# Patient Record
Sex: Male | Born: 1985 | Race: White | Hispanic: No | Marital: Single | Smoking: Never smoker
Health system: Southern US, Community
[De-identification: ages and names within clinical notes are randomized; demographics above are authoritative.]

## PROBLEM LIST (undated history)

## (undated) DIAGNOSIS — N200 Calculus of kidney: Secondary | ICD-10-CM

---

## 2016-06-04 ENCOUNTER — Emergency Department (HOSPITAL_COMMUNITY): Payer: Self-pay

## 2016-06-04 ENCOUNTER — Emergency Department (HOSPITAL_COMMUNITY)
Admission: EM | Admit: 2016-06-04 | Discharge: 2016-06-04 | Disposition: A | Discharge status: Home / Self Care | Payer: Self-pay | Attending: Emergency Medicine | Admitting: Emergency Medicine

## 2016-06-04 ENCOUNTER — Encounter (HOSPITAL_COMMUNITY): Payer: Self-pay

## 2016-06-04 DIAGNOSIS — J189 Pneumonia, unspecified organism: Secondary | ICD-10-CM | POA: Insufficient documentation

## 2016-06-04 DIAGNOSIS — Z79899 Other long term (current) drug therapy: Secondary | ICD-10-CM | POA: Insufficient documentation

## 2016-06-04 DIAGNOSIS — R319 Hematuria, unspecified: Secondary | ICD-10-CM | POA: Insufficient documentation

## 2016-06-04 HISTORY — DX: Calculus of kidney: N20.0

## 2016-06-04 LAB — URINALYSIS, ROUTINE W REFLEX MICROSCOPIC
Bilirubin Urine: NEGATIVE
Glucose, UA: NEGATIVE mg/dL
KETONES UR: NEGATIVE mg/dL
Leukocytes, UA: NEGATIVE
Nitrite: NEGATIVE
PROTEIN: NEGATIVE mg/dL
Specific Gravity, Urine: 1.028 (ref 1.005–1.030)
pH: 6.5 (ref 5.0–8.0)

## 2016-06-04 LAB — URINE MICROSCOPIC-ADD ON: BACTERIA UA: NONE SEEN

## 2016-06-04 LAB — I-STAT CREATININE, ED: Creatinine, Ser: 0.7 mg/dL (ref 0.61–1.24)

## 2016-06-04 MED ORDER — OXYCODONE-ACETAMINOPHEN 5-325 MG PO TABS
2.0000 | ORAL_TABLET | Freq: Once | ORAL | Status: AC
  Administered 2016-06-04: 2 via ORAL
  Filled 2016-06-04: qty 2

## 2016-06-04 MED ORDER — HYDROMORPHONE HCL 1 MG/ML IJ SOLN
1.0000 mg | Freq: Once | INTRAMUSCULAR | Status: AC
  Administered 2016-06-04: 1 mg via INTRAVENOUS
  Filled 2016-06-04: qty 1

## 2016-06-04 MED ORDER — AZITHROMYCIN 250 MG PO TABS: 250.0000 mg | ORAL_TABLET | Freq: Every day | ORAL | Status: AC

## 2016-06-04 MED ORDER — ONDANSETRON HCL 4 MG/2ML IJ SOLN
4.0000 mg | Freq: Once | INTRAMUSCULAR | Status: AC
  Administered 2016-06-04: 4 mg via INTRAVENOUS
  Filled 2016-06-04: qty 2

## 2016-06-04 MED ORDER — OXYCODONE-ACETAMINOPHEN 5-325 MG PO TABS: 1.0000 | ORAL_TABLET | ORAL | Status: AC | PRN

## 2016-06-04 NOTE — Discharge Instructions (Signed)
Community-Acquired Pneumonia, Adult °Pneumonia is an infection of the lungs. There are different types of pneumonia. One type can develop while a person is in a hospital. A different type, called community-acquired pneumonia, develops in people who are not, or have not recently been, in the hospital or other health care facility.  °CAUSES °Pneumonia may be caused by bacteria, viruses, or funguses. Community-acquired pneumonia is often caused by Streptococcus pneumonia bacteria. These bacteria are often passed from one person to another by breathing in droplets from the cough or sneeze of an infected person. °RISK FACTORS °The condition is more likely to develop in: °· People who have chronic diseases, such as chronic obstructive pulmonary disease (COPD), asthma, congestive heart failure, cystic fibrosis, diabetes, or kidney disease. °· People who have early-stage or late-stage HIV. °· People who have sickle cell disease. °· People who have had their spleen removed (splenectomy). °· People who have poor dental hygiene. °· People who have medical conditions that increase the risk of breathing in (aspirating) secretions their own mouth and nose.   °· People who have a weakened immune system (immunocompromised). °· People who smoke. °· People who travel to areas where pneumonia-causing germs commonly exist. °· People who are around animal habitats or animals that have pneumonia-causing germs, including birds, bats, rabbits, cats, and farm animals. °SYMPTOMS °Symptoms of this condition include: °· A dry cough. °· A wet (productive) cough. °· Fever. °· Sweating. °· Chest pain, especially when breathing deeply or coughing. °· Rapid breathing or difficulty breathing. °· Shortness of breath. °· Shaking chills. °· Fatigue. °· Muscle aches. °DIAGNOSIS °Your health care provider will take a medical history and perform a physical exam. You may also have other tests, including: °· Imaging studies of your chest, including  X-rays. °· Tests to check your blood oxygen level and other blood gases. °· Other tests on blood, mucus (sputum), fluid around your lungs (pleural fluid), and urine. °If your pneumonia is severe, other tests may be done to identify the specific cause of your illness. °TREATMENT °The type of treatment that you receive depends on many factors, such as the cause of your pneumonia, the medicines you take, and other medical conditions that you have. For most adults, treatment and recovery from pneumonia may occur at home. In some cases, treatment must happen in a hospital. Treatment may include: °· Antibiotic medicines, if the pneumonia was caused by bacteria. °· Antiviral medicines, if the pneumonia was caused by a virus. °· Medicines that are given by mouth or through an IV tube. °· Oxygen. °· Respiratory therapy. °Although rare, treating severe pneumonia may include: °· Mechanical ventilation. This is done if you are not breathing well on your own and you cannot maintain a safe blood oxygen level. °· Thoracentesis. This procedure removes fluid around one lung or both lungs to help you breathe better. °HOME CARE INSTRUCTIONS °· Take over-the-counter and prescription medicines only as told by your health care provider. °¨ Only take cough medicine if you are losing sleep. Understand that cough medicine can prevent your body's natural ability to remove mucus from your lungs. °¨ If you were prescribed an antibiotic medicine, take it as told by your health care provider. Do not stop taking the antibiotic even if you start to feel better. °· Sleep in a semi-upright position at night. Try sleeping in a reclining chair, or place a few pillows under your head. °· Do not use tobacco products, including cigarettes, chewing tobacco, and e-cigarettes. If you need help quitting, ask your health care provider. °· Drink enough water to keep your urine   clear or pale yellow. This will help to thin out mucus secretions in your  lungs. PREVENTION There are ways that you can decrease your risk of developing community-acquired pneumonia. Consider getting a pneumococcal vaccine if:  You are older than 31 years of age.  You are older than 31 years of age and are undergoing cancer treatment, have chronic lung disease, or have other medical conditions that affect your immune system. Ask your health care provider if this applies to you. There are different types and schedules of pneumococcal vaccines. Ask your health care provider which vaccination option is best for you. You may also prevent community-acquired pneumonia if you take these actions:  Get an influenza vaccine every year. Ask your health care provider which type of influenza vaccine is best for you.  Go to the dentist on a regular basis.  Wash your hands often. Use hand sanitizer if soap and water are not available. SEEK MEDICAL CARE IF:  You have a fever.  You are losing sleep because you cannot control your cough with cough medicine. SEEK IMMEDIATE MEDICAL CARE IF:  You have worsening shortness of breath.  You have increased chest pain.  Your sickness becomes worse, especially if you are an older adult or have a weakened immune system.  You cough up blood.   This information is not intended to replace advice given to you by your health care provider. Make sure you discuss any questions you have with your health care provider.   Document Released: 11/10/2005 Document Revised: 08/01/2015 Document Reviewed: 03/07/2015 Elsevier Interactive Patient Education 2016 [employer]   Hematuria, Adult Hematuria is blood in your urine. It can be caused by a bladder infection, kidney infection, prostate infection, kidney stone, or cancer of your urinary tract. Infections can usually be treated with medicine, and a kidney stone usually will pass through your urine. If neither of these is the cause of your hematuria, further workup to find out the reason may  be needed. It is very important that you tell your health care provider about any blood you see in your urine, even if the blood stops without treatment or happens without causing pain. Blood in your urine that happens and then stops and then happens again can be a symptom of a very serious condition. Also, pain is not a symptom in the initial stages of many urinary cancers. HOME CARE INSTRUCTIONS   Drink lots of fluid, 3-4 quarts a day. If you have been diagnosed with an infection, cranberry juice is especially recommended, in addition to large amounts of water.  Avoid caffeine, tea, and carbonated beverages because they tend to irritate the bladder.  Avoid alcohol because it may irritate the prostate.  Take all medicines as directed by your health care provider.  If you were prescribed an antibiotic medicine, finish it all even if you start to feel better.  If you have been diagnosed with a kidney stone, follow your health care provider's instructions regarding straining your urine to catch the stone.  Empty your bladder often. Avoid holding urine for long periods of time.  After a bowel movement, women should cleanse front to back. Use each tissue only once.  Empty your bladder before and after sexual intercourse if you are a male. SEEK MEDICAL CARE IF:  You develop back pain.  You have a fever.  You have a feeling of sickness in your stomach (nausea) or vomiting.  Your symptoms are not better in 3 days. Return sooner if you  are getting worse. SEEK IMMEDIATE MEDICAL CARE IF:   You develop severe vomiting and are unable to keep the medicine down.  You develop severe back or abdominal pain despite taking your medicines.  You begin passing a large amount of blood or clots in your urine.  You feel extremely weak or faint, or you pass out. MAKE SURE YOU:   Understand these instructions.  Will watch your condition.  Will get help right away if you are not doing well or get  worse.   This information is not intended to replace advice given to you by your health care provider. Make sure you discuss any questions you have with your health care provider.   Document Released: 11/10/2005 Document Revised: 12/01/2014 Document Reviewed: 07/11/2013 Elsevier Interactive Patient Education [employer]

## 2016-06-04 NOTE — ED Notes (Signed)
Nurse will attempt to draw labs on pt when they start the IV.

## 2016-06-04 NOTE — ED Provider Notes (Signed)
CSN: [id number]     Arrival date & time 06/04/16  0231 History   First MD Initiated Contact with Patient 06/04/16 0402     Chief Complaint  Patient presents with  . Flank Pain     (Consider location/radiation/quality/duration/timing/severity/associated sxs/prior Treatment) HPI Comments: 30 year old male with a history of kidney stones presents to the emergency department for left flank pain 1 day. He states that pain has been intermittent and waxing and waning in severity. He has noticed a gradual worsening of his pain since it began. He has taken Tylenol for symptoms without relief. He feels radiation of pain from his left flank around to his left lower quadrant. He states that his pain feels similar to prior kidney stones. She denies any gross hematuria, fever, vomiting, or syncope. No bowel changes. No history of abdominal surgeries.  Patient is a 30 y.o. male presenting with flank pain. The history is provided by the patient. No language interpreter was used.  Flank Pain This is a new problem. The current episode started yesterday. The problem occurs intermittently. The problem has been waxing and waning. Associated symptoms include abdominal pain and nausea. Pertinent negatives include no change in bowel habit, fever, numbness, rash, vomiting or weakness. Nothing aggravates the symptoms. He has tried acetaminophen for the symptoms. The treatment provided no relief.    Past Medical History  Diagnosis Date  . Kidney stones    History reviewed. No pertinent past surgical history. History reviewed. No pertinent family history. Social History  Substance Use Topics  . Smoking status: Never Smoker   . Smokeless tobacco: None  . Alcohol Use: No    Review of Systems  Constitutional: Negative for fever.  Gastrointestinal: Positive for nausea and abdominal pain. Negative for vomiting and change in bowel habit.  Genitourinary: Positive for flank pain.  Skin: Negative for rash.   Neurological: Negative for weakness and numbness.  All other systems reviewed and are negative.   Allergies  Review of patient's allergies indicates no known allergies.  Home Medications   Prior to Admission medications   Medication Sig Start Date End Date Taking? Authorizing Provider  acetaminophen (TYLENOL) 500 MG tablet Take 1,000 mg by mouth every 6 (six) hours as needed for mild pain, moderate pain or headache.   Yes Historical Provider, MD  azithromycin (ZITHROMAX) 250 MG tablet Take 1 tablet (250 mg total) by mouth daily. Take first 2 tablets together, then 1 every day until finished. 06/04/16   Antony MaduraKelly Oluwatimileyin Vivier, PA-C  oxyCODONE-acetaminophen (PERCOCET/ROXICET) 5-325 MG tablet Take 1 tablet by mouth every 4 (four) hours as needed for severe pain. 06/04/16   Antony MaduraKelly Onyx Edgley, PA-C   BP 120/76 mmHg  Pulse 88  Temp(Src) 98.3 F (36.8 C) (Oral)  Resp 18  Ht 5\' 11"  (1.803 m)  Wt 127.007 kg  BMI 39.07 kg/m2  SpO2 99%   Physical Exam  Constitutional: He is oriented to person, place, and time. He appears well-developed and well-nourished. No distress.  Nontoxic appearing, though patient does seem uncomfortable.  HENT:  Head: Normocephalic and atraumatic.  Eyes: Conjunctivae and EOM are normal. No scleral icterus.  Neck: Normal range of motion.  Cardiovascular: Normal rate, regular rhythm and intact distal pulses.   Pulmonary/Chest: Effort normal and breath sounds normal. No respiratory distress. He has no wheezes. He has no rales.  Respirations even and unlabored  Abdominal: Soft. He exhibits no distension. There is no tenderness. There is no rebound and no guarding.  Soft, obese abdomen. No masses. No  peritoneal signs. No left CVA tenderness to palpation.  Musculoskeletal: Normal range of motion.  Neurological: He is alert and oriented to person, place, and time. He exhibits normal muscle tone. Coordination normal.  GCS 15. Patient ambulatory with steady gait.  Skin: Skin is warm and  dry. No rash noted. He is not diaphoretic. No erythema. No pallor.  Psychiatric: He has a normal mood and affect. His behavior is normal.  Nursing note and vitals reviewed.   ED Course  Procedures (including critical care time) Labs Review Labs Reviewed  URINALYSIS, ROUTINE W REFLEX MICROSCOPIC (NOT AT [facility]) - Abnormal; Notable for the following:    Hgb urine dipstick LARGE (*)    All other components within normal limits  URINE MICROSCOPIC-ADD ON - Abnormal; Notable for the following:    Squamous Epithelial / LPF 0-5 (*)    All other components within normal limits  I-STAT CREATININE, ED    Imaging Review Ct Renal Stone Study  06/04/2016  CLINICAL DATA:  Left flank pain for 1 day. Microhematuria. History of kidney stones. EXAM: CT ABDOMEN AND PELVIS WITHOUT CONTRAST TECHNIQUE: Multidetector CT imaging of the abdomen and pelvis was performed following the standard protocol without IV contrast. COMPARISON:  None. FINDINGS: Patchy infiltration in the left lung base may indicate pneumonia. Kidneys are symmetrical in size and shape. No hydronephrosis or hydroureter. No renal, ureteral, or bladder stones. Bladder is decompressed. Unenhanced appearance of the liver, spleen, gallbladder, pancreas, adrenal glands, abdominal aorta, inferior vena cava, and retroperitoneal lymph nodes is unremarkable. Stomach, small bowel, and colon are not abnormally distended. Postoperative scarring in the anterior abdominal wall. Nodular scarring in the midline at the anterior abdominal wall is likely also postoperative. Small fat containing hernia at the base of the scar. No free air or free fluid in the abdomen. Pelvis: Prostate gland is not enlarged. No free or loculated pelvic fluid collections. No pelvic mass or lymphadenopathy. Appendix is normal. No destructive bone lesions. IMPRESSION: Patchy focal infiltration suggested in the left lung base may indicate pneumonia. No renal or ureteral stone or obstruction.  Postoperative scarring in the anterior abdominal wall with minimal residual or recurrent abdominal wall hernia containing fat. Electronically Signed   By: Burman Nieves M.D.   On: 06/04/2016 05:15     I have personally reviewed and evaluated these images and lab results as part of my medical decision-making.   EKG Interpretation None      MDM   Final diagnoses:  Community acquired pneumonia  Hematuria    30 year old male presents to the emergency department for evaluation of left flank pain 24 hours. He states that he has a history of kidney stones for which she was treated in [place] at [facility]. He also reports a history of acute renal failure. He attributes this to prolonged ibuprofen use and states that he does not usually take NSAIDs for this reason. He took Tylenol for pain prior to arrival without relief. He denies any pleuritic nature of his pain as well as any associated fevers. He states that his pain feels similar to past kidney stones.  Patient was found to have hematuria on UA with intact kidney function. CT scan was obtained to evaluate for kidney stone. No evidence of ureterolithiasis or hydronephrosis on scan today. There is possibility of pneumonia in the left lower lung base. Patient has been afebrile and without hypoxia since arrival. He denies any recent URI symptoms.  Patient states that he recently relocated to the  [place] area. Attempted to obtain records from [facility] via Care Everywhere; however, there is no individual matching this patient's demographics. No evidence of patient existing in the [place] Substance Database. Unable to clearly define motive for visit today, though higher suspicion for DSB nonetheless.   Will refer to urology for outpatient follow up. Patient to be started on Azithromycin. Will d/c with short course of percocet for pain control; tablet #6. No indication for further emergent workup at this time. Patient  discharged in satisfactory condition with no unaddressed concerns.    Antony Madura, PA-C 06/04/16 1610  April Palumbo, MD 06/04/16 [phone]

## 2016-06-04 NOTE — ED Notes (Signed)
PA at bedside.

## 2016-06-04 NOTE — ED Notes (Signed)
Pt complains of left flank pain for one day

## 2017-12-04 IMAGING — CT CT RENAL STONE PROTOCOL
2 of 3 series · 17 of 46 positions shown, 19 images · non-contrast
Comparison: None.

CLINICAL DATA: Left flank pain for 1 day. Microhematuria. History
of kidney stones.

EXAM:
CT ABDOMEN AND PELVIS WITHOUT CONTRAST
TECHNIQUE: Multidetector CT imaging of the abdomen and pelvis was performed
following the standard protocol without IV contrast.

[Series 3: lung · axial · 0.85mm/px · z∈[+1298,+1382]mm · 14 of 50 slices shown, 16 images]
[im 4/50  soft-tissue]
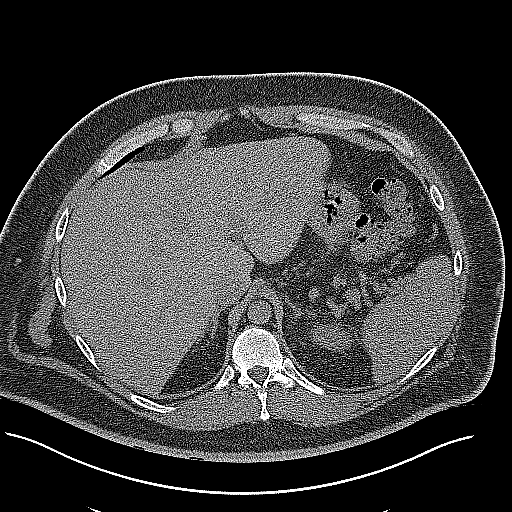
[im 4/50  bone]
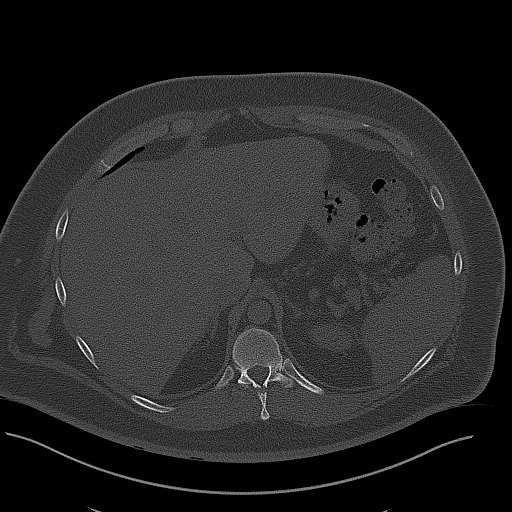
[im 7/50  soft-tissue]
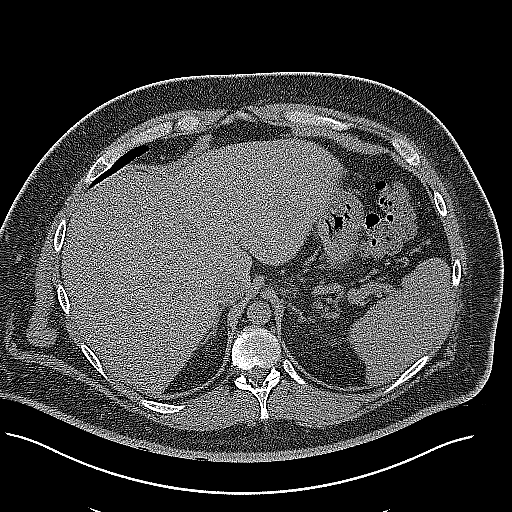
[im 10/50  soft-tissue]
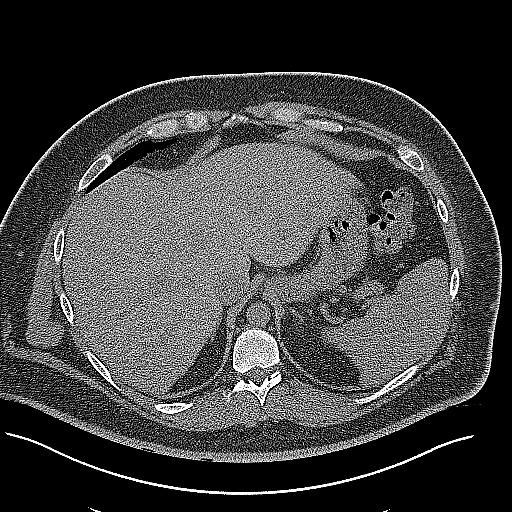
[im 13/50  soft-tissue]
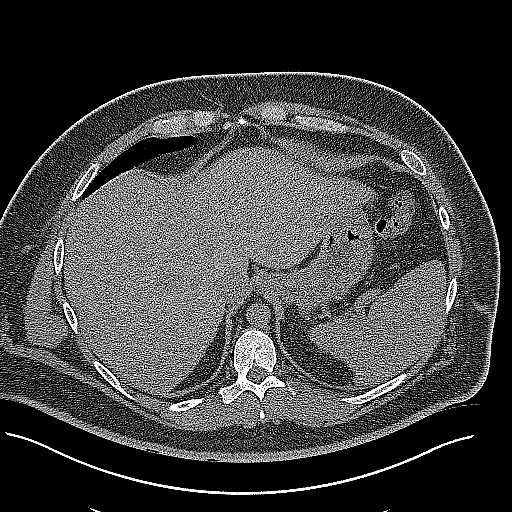
[im 16/50  soft-tissue]
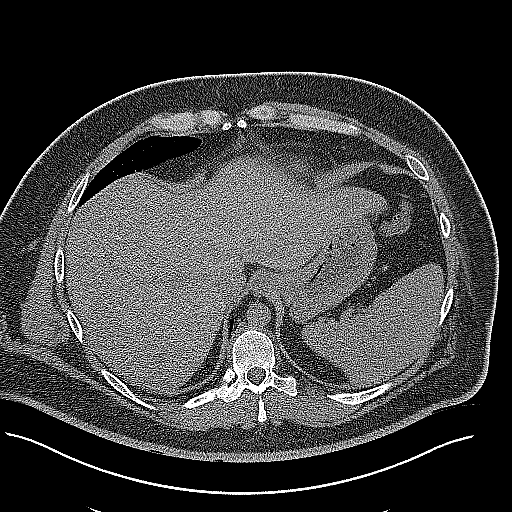
[im 19/50  soft-tissue]
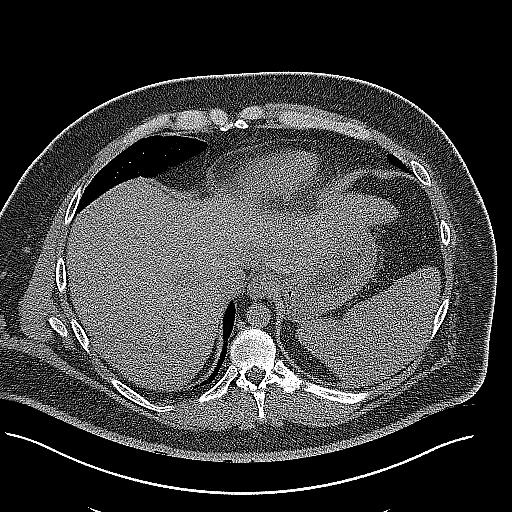
[im 23/50  soft-tissue]
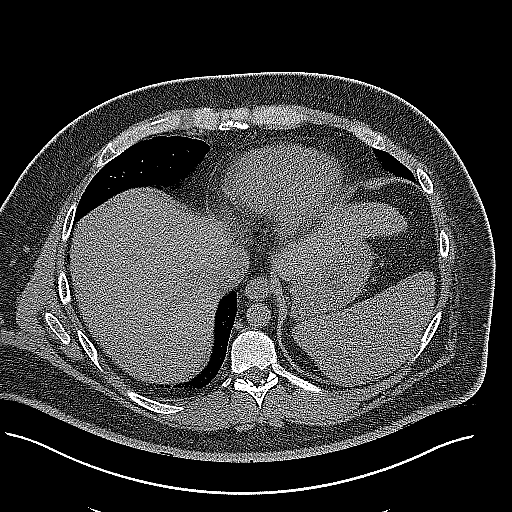
[im 27/50  soft-tissue]
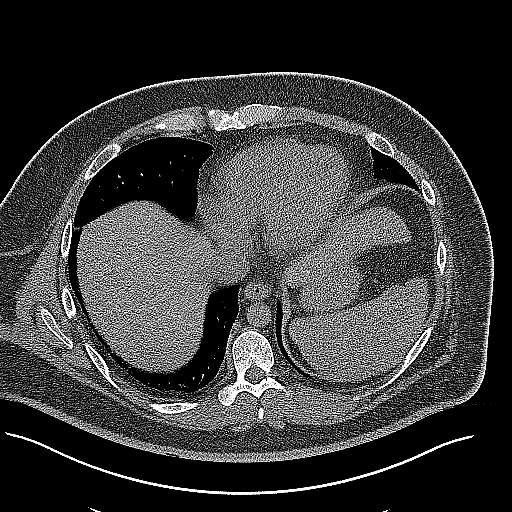
[im 31/50  soft-tissue]
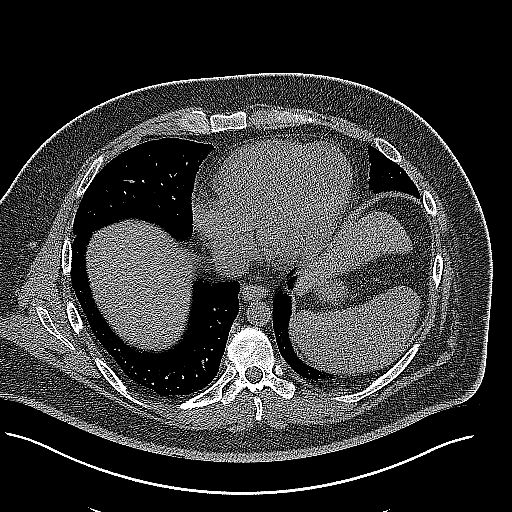
[im 31/50  bone]
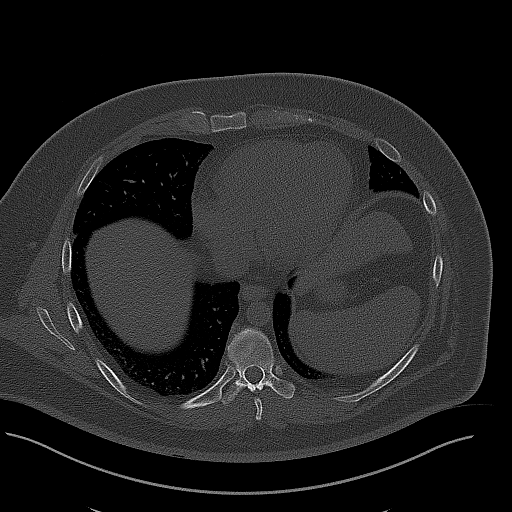
[im 34/50  soft-tissue]
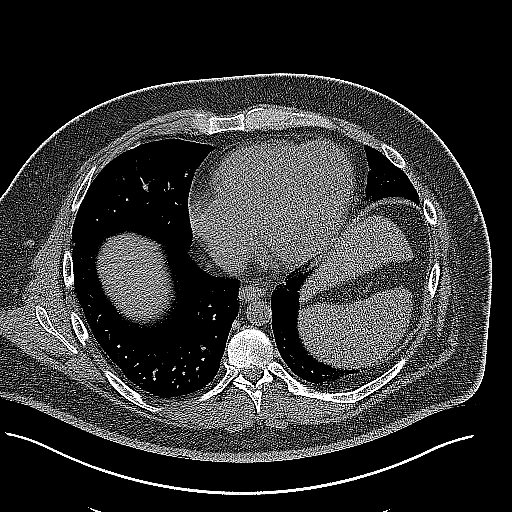
[im 37/50  soft-tissue]
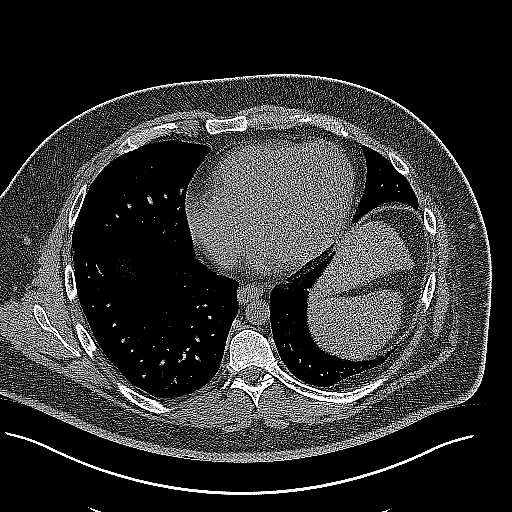
[im 40/50  soft-tissue]
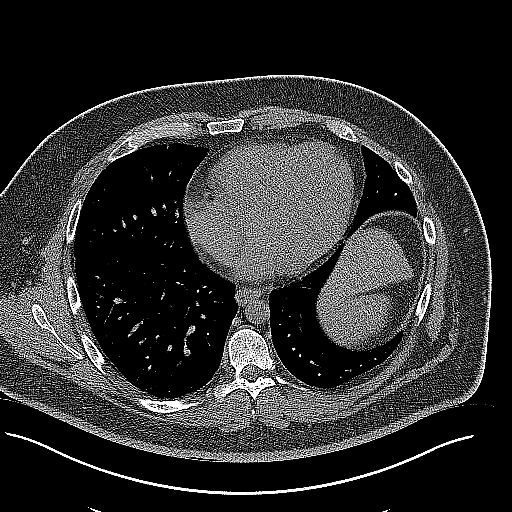
[im 43/50  soft-tissue]
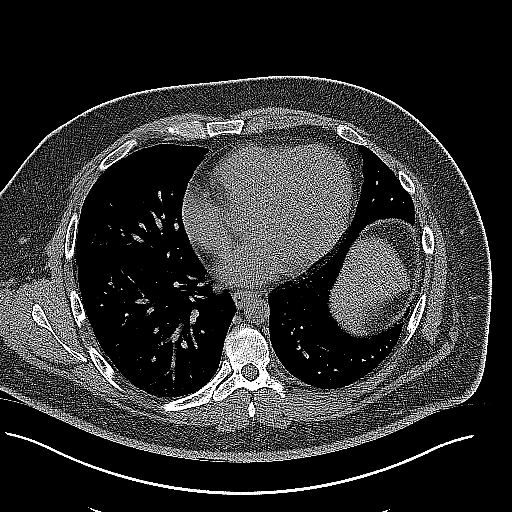
[im 46/50  soft-tissue]
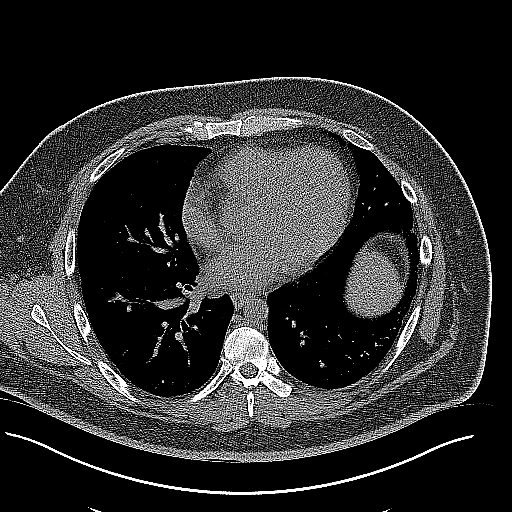

[Series 4: coronal · coronal · 0.83mm/px · 3 of 149 slices shown]
[im 50/149  soft-tissue]
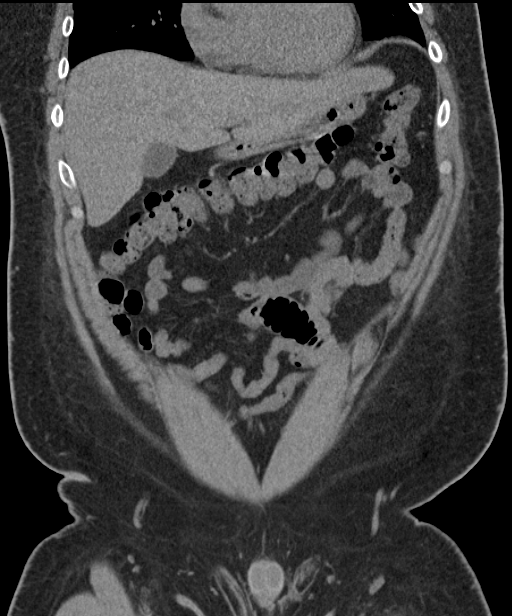
[im 66/149  soft-tissue]
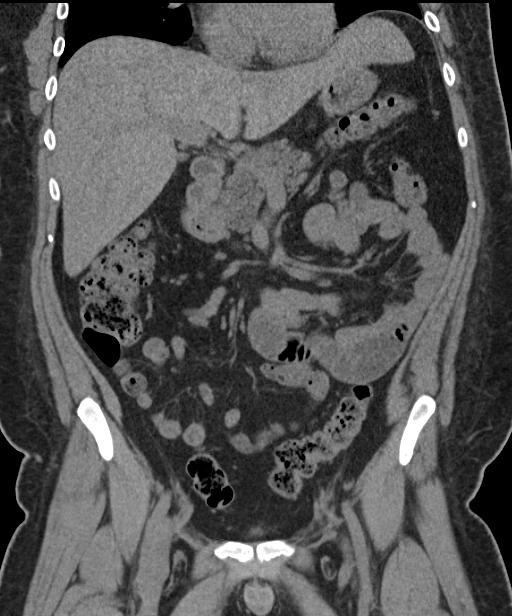
[im 83/149  soft-tissue]
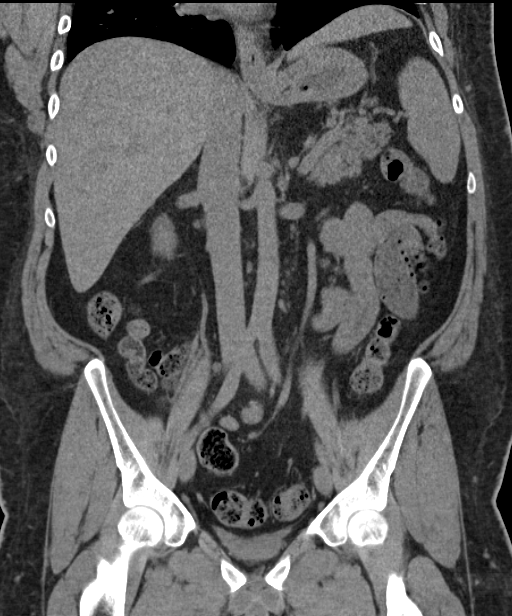

[17 of 46 positions shown; findings below may reference images not displayed]

FINDINGS: Patchy infiltration in the left lung base may indicate pneumonia.

Kidneys are symmetrical in size and shape. No hydronephrosis or
hydroureter. No renal, ureteral, or bladder stones. Bladder is
decompressed.

Unenhanced appearance of the liver, spleen, gallbladder, pancreas,
adrenal glands, abdominal aorta, inferior vena cava, and
retroperitoneal lymph nodes is unremarkable. Stomach, small bowel,
and colon are not abnormally distended. Postoperative scarring in
the anterior abdominal wall. Nodular scarring in the midline at the
anterior abdominal wall is likely also postoperative. Small fat
containing hernia at the base of the scar. No free air or free fluid
in the abdomen.

Pelvis: Prostate gland is not enlarged. No free or loculated pelvic
fluid collections. No pelvic mass or lymphadenopathy. Appendix is
normal. No destructive bone lesions.
IMPRESSION: Patchy focal infiltration suggested in the left lung base may
indicate pneumonia. No renal or ureteral stone or obstruction.
Postoperative scarring in the anterior abdominal wall with minimal
residual or recurrent abdominal wall hernia containing fat.
